# Patient Record
Sex: Male | Born: 1995 | Race: White | Hispanic: No | Marital: Single | Smoking: Former smoker
Health system: Southern US, Community
[De-identification: ages and names within clinical notes are randomized; demographics above are authoritative.]

## PROBLEM LIST (undated history)

## (undated) VITALS — BP 96/59 | HR 62 | Temp 97.5°F | Resp 16 | Ht 75.59 in | Wt 178.6 lb

## (undated) DIAGNOSIS — F909 Attention-deficit hyperactivity disorder, unspecified type: Secondary | ICD-10-CM

## (undated) DIAGNOSIS — F32A Depression, unspecified: Secondary | ICD-10-CM

## (undated) DIAGNOSIS — F329 Major depressive disorder, single episode, unspecified: Secondary | ICD-10-CM

## (undated) DIAGNOSIS — Z789 Other specified health status: Secondary | ICD-10-CM

## (undated) HISTORY — DX: Other specified health status: Z78.9

---
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
## 2017-06-24 ENCOUNTER — Emergency Department

## 2017-06-24 ENCOUNTER — Emergency Department
Admission: EM | Admit: 2017-06-24 | Discharge: 2017-06-24 | Attending: Emergency Medicine | Admitting: Emergency Medicine

## 2017-06-24 ENCOUNTER — Other Ambulatory Visit: Payer: Self-pay

## 2017-06-24 DIAGNOSIS — Z87891 Personal history of nicotine dependence: Secondary | ICD-10-CM | POA: Diagnosis not present

## 2017-06-24 DIAGNOSIS — Y939 Activity, unspecified: Secondary | ICD-10-CM | POA: Insufficient documentation

## 2017-06-24 DIAGNOSIS — Z23 Encounter for immunization: Secondary | ICD-10-CM | POA: Diagnosis not present

## 2017-06-24 DIAGNOSIS — Y999 Unspecified external cause status: Secondary | ICD-10-CM | POA: Insufficient documentation

## 2017-06-24 DIAGNOSIS — Y92143 Cell of prison as the place of occurrence of the external cause: Secondary | ICD-10-CM | POA: Diagnosis not present

## 2017-06-24 DIAGNOSIS — S0003XA Contusion of scalp, initial encounter: Secondary | ICD-10-CM | POA: Diagnosis not present

## 2017-06-24 DIAGNOSIS — S0990XA Unspecified injury of head, initial encounter: Secondary | ICD-10-CM | POA: Insufficient documentation

## 2017-06-24 DIAGNOSIS — F909 Attention-deficit hyperactivity disorder, unspecified type: Secondary | ICD-10-CM | POA: Diagnosis not present

## 2017-06-24 DIAGNOSIS — S022XXA Fracture of nasal bones, initial encounter for closed fracture: Secondary | ICD-10-CM | POA: Diagnosis not present

## 2017-06-24 LAB — URINALYSIS, COMPLETE (UACMP) WITH MICROSCOPIC
BILIRUBIN URINE: NEGATIVE
Bacteria, UA: NONE SEEN
Glucose, UA: NEGATIVE mg/dL
HGB URINE DIPSTICK: NEGATIVE
Ketones, ur: NEGATIVE mg/dL
Leukocytes, UA: NEGATIVE
NITRITE: NEGATIVE
Protein, ur: NEGATIVE mg/dL
SPECIFIC GRAVITY, URINE: 1.012 (ref 1.005–1.030)
Squamous Epithelial / LPF: NONE SEEN
pH: 5 (ref 5.0–8.0)

## 2017-06-24 MED ORDER — TETANUS-DIPHTH-ACELL PERTUSSIS 5-2.5-18.5 LF-MCG/0.5 IM SUSP
0.5000 mL | Freq: Once | INTRAMUSCULAR | Status: AC
  Administered 2017-06-24: 0.5 mL via INTRAMUSCULAR
  Filled 2017-06-24: qty 0.5

## 2017-06-24 NOTE — ED Notes (Signed)
PT given urinal 
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
## 2017-06-24 NOTE — Discharge Instructions (Signed)
Your CT scans shows a nondisplaced fracture of your nose.  No immediate treatment is needed for this, but you should follow up with ENT in 1 week for further evaluation.  Your elbow xray did not show any fractures. We gave you a tetanus shot today.  Your urine test did not show any blood in the urine.

## 2018-04-23 ENCOUNTER — Encounter (HOSPITAL_COMMUNITY): Payer: Self-pay | Admitting: Emergency Medicine

## 2018-04-23 ENCOUNTER — Emergency Department (HOSPITAL_COMMUNITY)
Admission: EM | Admit: 2018-04-23 | Discharge: 2018-04-24 | Disposition: A | Discharge status: Other Acute Inpatient Hospital | Payer: Medicaid Other | Attending: Emergency Medicine | Admitting: Emergency Medicine

## 2018-04-23 ENCOUNTER — Other Ambulatory Visit: Payer: Self-pay

## 2018-04-23 DIAGNOSIS — R45851 Suicidal ideations: Secondary | ICD-10-CM | POA: Insufficient documentation

## 2018-04-23 DIAGNOSIS — Z87891 Personal history of nicotine dependence: Secondary | ICD-10-CM | POA: Insufficient documentation

## 2018-04-23 DIAGNOSIS — F1220 Cannabis dependence, uncomplicated: Secondary | ICD-10-CM | POA: Insufficient documentation

## 2018-04-23 DIAGNOSIS — F909 Attention-deficit hyperactivity disorder, unspecified type: Secondary | ICD-10-CM | POA: Insufficient documentation

## 2018-04-23 DIAGNOSIS — F332 Major depressive disorder, recurrent severe without psychotic features: Secondary | ICD-10-CM | POA: Diagnosis present

## 2018-04-23 DIAGNOSIS — F333 Major depressive disorder, recurrent, severe with psychotic symptoms: Secondary | ICD-10-CM | POA: Insufficient documentation

## 2018-04-23 LAB — CBC
HCT: 47 % (ref 39.0–52.0)
Hemoglobin: 15.9 g/dL (ref 13.0–17.0)
MCH: 32 pg (ref 26.0–34.0)
MCHC: 33.8 g/dL (ref 30.0–36.0)
MCV: 94.6 fL (ref 80.0–100.0)
PLATELETS: 169 10*3/uL (ref 150–400)
RBC: 4.97 MIL/uL (ref 4.22–5.81)
RDW: 12 % (ref 11.5–15.5)
WBC: 7.7 10*3/uL (ref 4.0–10.5)
nRBC: 0.3 % — ABNORMAL HIGH (ref 0.0–0.2)

## 2018-04-23 LAB — RAPID URINE DRUG SCREEN, HOSP PERFORMED
Amphetamines: POSITIVE — AB
Barbiturates: NOT DETECTED
Benzodiazepines: NOT DETECTED
COCAINE: NOT DETECTED
OPIATES: NOT DETECTED
TETRAHYDROCANNABINOL: POSITIVE — AB

## 2018-04-23 LAB — COMPREHENSIVE METABOLIC PANEL
ALT: 19 U/L (ref 0–44)
ANION GAP: 6 (ref 5–15)
AST: 34 U/L (ref 15–41)
Albumin: 3.7 g/dL (ref 3.5–5.0)
Alkaline Phosphatase: 47 U/L (ref 38–126)
BUN: 16 mg/dL (ref 6–20)
CHLORIDE: 108 mmol/L (ref 98–111)
CO2: 28 mmol/L (ref 22–32)
Calcium: 8.6 mg/dL — ABNORMAL LOW (ref 8.9–10.3)
Creatinine, Ser: 0.96 mg/dL (ref 0.61–1.24)
GFR calc non Af Amer: >60 mL/min (ref >60)
GLUCOSE: 72 mg/dL (ref 70–99)
Potassium: 3.7 mmol/L (ref 3.5–5.1)
SODIUM: 142 mmol/L (ref 135–145)
Total Bilirubin: 0.8 mg/dL (ref 0.3–1.2)
Total Protein: 6 g/dL — ABNORMAL LOW (ref 6.5–8.1)

## 2018-04-23 LAB — SALICYLATE LEVEL

## 2018-04-23 LAB — ACETAMINOPHEN LEVEL

## 2018-04-23 LAB — ETHANOL: Alcohol, Ethyl (B): <10 mg/dL (ref <10)
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
## 2018-04-23 NOTE — BHH Counselor (Signed)
Per Cory Duncan, [facility]. Pt can come tomorrow (04/24/2018) after 0830. Attending physician: Dr. Otelia SanteeFarrah. Nursing report: [phone]. Updated disposition discussed with Cory LaymanLindsey, PA and Cory RainbowIlene, RN.    Redmond Pullingreylese D Alden Bensinger, MS, [facility], [facility] Triage Specialist [phone]

## 2018-04-23 NOTE — ED Triage Notes (Signed)
Pt presents for mental health evaluation with suicidal ideation but no plan.

## 2018-04-23 NOTE — ED Notes (Signed)
TTS called and advised pt was slated for inpatient status and assessment of bed availability was being done at this time [facility].

## 2018-04-23 NOTE — ED Notes (Signed)
Bed: [facility] Expected date:  Expected time:  Means of arrival:  Comments: 
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
## 2018-04-24 ENCOUNTER — Inpatient Hospital Stay (HOSPITAL_COMMUNITY): Admission: AD | Admit: 2018-04-24 | Payer: Medicaid Other | Source: Intra-hospital | Admitting: Psychiatry

## 2018-04-24 ENCOUNTER — Encounter (HOSPITAL_COMMUNITY): Payer: Self-pay

## 2018-04-24 ENCOUNTER — Inpatient Hospital Stay (HOSPITAL_COMMUNITY)
Admission: AD | Admit: 2018-04-24 | Discharge: 2018-04-28 | DRG: 885 | Disposition: A | Discharge status: Home / Self Care | Payer: Medicaid Other | Source: Intra-hospital | Attending: Psychiatry | Admitting: Psychiatry

## 2018-04-24 DIAGNOSIS — F4310 Post-traumatic stress disorder, unspecified: Secondary | ICD-10-CM | POA: Diagnosis not present

## 2018-04-24 DIAGNOSIS — Z813 Family history of other psychoactive substance abuse and dependence: Secondary | ICD-10-CM

## 2018-04-24 DIAGNOSIS — Z818 Family history of other mental and behavioral disorders: Secondary | ICD-10-CM | POA: Diagnosis not present

## 2018-04-24 DIAGNOSIS — R45851 Suicidal ideations: Secondary | ICD-10-CM | POA: Diagnosis not present

## 2018-04-24 DIAGNOSIS — G4700 Insomnia, unspecified: Secondary | ICD-10-CM | POA: Diagnosis present

## 2018-04-24 DIAGNOSIS — Z915 Personal history of self-harm: Secondary | ICD-10-CM | POA: Diagnosis not present

## 2018-04-24 DIAGNOSIS — F1910 Other psychoactive substance abuse, uncomplicated: Secondary | ICD-10-CM | POA: Diagnosis not present

## 2018-04-24 DIAGNOSIS — F419 Anxiety disorder, unspecified: Secondary | ICD-10-CM | POA: Diagnosis not present

## 2018-04-24 DIAGNOSIS — F323 Major depressive disorder, single episode, severe with psychotic features: Secondary | ICD-10-CM | POA: Diagnosis not present

## 2018-04-24 DIAGNOSIS — Z23 Encounter for immunization: Secondary | ICD-10-CM | POA: Diagnosis not present

## 2018-04-24 DIAGNOSIS — Z833 Family history of diabetes mellitus: Secondary | ICD-10-CM | POA: Diagnosis not present

## 2018-04-24 DIAGNOSIS — F17200 Nicotine dependence, unspecified, uncomplicated: Secondary | ICD-10-CM | POA: Diagnosis not present

## 2018-04-24 MED ORDER — MAGNESIUM HYDROXIDE 400 MG/5ML PO SUSP: 30.0000 mL | Freq: Every day | ORAL | Status: DC | PRN

## 2018-04-24 MED ORDER — PNEUMOCOCCAL VAC POLYVALENT 25 MCG/0.5ML IJ INJ
0.5000 mL | INJECTION | INTRAMUSCULAR | Status: AC
  Administered 2018-04-25: 0.5 mL via INTRAMUSCULAR

## 2018-04-24 MED ORDER — NICOTINE 21 MG/24HR TD PT24
21.0000 mg | MEDICATED_PATCH | Freq: Every day | TRANSDERMAL | Status: DC
  Administered 2018-04-24 – 2018-04-28 (×5): 21 mg via TRANSDERMAL
  Filled 2018-04-24 (×8): qty 1

## 2018-04-24 MED ORDER — PRAZOSIN HCL 1 MG PO CAPS
1.0000 mg | ORAL_CAPSULE | Freq: Every day | ORAL | Status: DC
  Administered 2018-04-24: 1 mg via ORAL
  Filled 2018-04-24 (×4): qty 1

## 2018-04-24 MED ORDER — ALUM & MAG HYDROXIDE-SIMETH 200-200-20 MG/5ML PO SUSP: 30.0000 mL | ORAL | Status: DC | PRN

## 2018-04-24 MED ORDER — HYDROXYZINE HCL 25 MG PO TABS
25.0000 mg | ORAL_TABLET | Freq: Three times a day (TID) | ORAL | Status: DC | PRN
  Administered 2018-04-24 – 2018-04-27 (×3): 25 mg via ORAL
  Filled 2018-04-24 (×3): qty 1

## 2018-04-24 MED ORDER — TRAZODONE HCL 50 MG PO TABS
50.0000 mg | ORAL_TABLET | Freq: Every evening | ORAL | Status: DC | PRN
  Administered 2018-04-24 – 2018-04-27 (×4): 50 mg via ORAL
  Filled 2018-04-24 (×4): qty 1

## 2018-04-24 MED ORDER — ACETAMINOPHEN 325 MG PO TABS: 650.0000 mg | ORAL_TABLET | Freq: Four times a day (QID) | ORAL | Status: DC | PRN

## 2018-04-24 NOTE — BHH Counselor (Signed)
Per RN AC, [facility].  May arrive at 1100.

## 2018-04-24 NOTE — Progress Notes (Signed)
D: Pt was at nurse's station upon initial approach.  Pt presents with anxious, depressed affect and mood.  When asked how his day has been, he states "I guess as good as it can get."  His goal is to "get all my mental health in order.  Get on the right medication.  Get my mind right."  Pt denies SI/HI, denies hallucinations, denies pain.  Pt has been visible in milieu interacting with peers and staff appropriately.  Pt attended evening group.  He requested medication for nightmares.  A: Introduced self to pt.  Met with pt 1:1.  Actively listened to pt and provided support and encouragement.  Medications administered per order.  On-site provider notified of pt's request and Minipress 1 mg PO QHS was ordered and administered.  PRN medication administered for sleep and anxiety.  Fall prevention techniques reviewed with pt and he verbalized understanding.  Q15 minute safety checks maintained.  R: Pt is safe on the unit.  Pt is compliant with medications.  Pt verbally contracts for safety.  Will continue to monitor and assess.

## 2018-04-24 NOTE — Progress Notes (Signed)
[facility]. Patient will be a voluntary admit and [facility].  Bed Number: 503-2  RN Call for report: [phone]  Enid Cutterharlotte Melizza Kanode, LCSW-A Clinical Social Worker [phone]

## 2018-04-24 NOTE — Progress Notes (Signed)
Adult Psychoeducational Group Note  Date:  04/24/2018 Time:  9:53 PM  Group Topic/Focus:  Wrap-Up Group:   The focus of this group is to help patients review their daily goal of treatment and discuss progress on daily workbooks.  Participation Level:  Active  Participation Quality:  Appropriate  Affect:  Appropriate  Cognitive:  Appropriate  Insight: Appropriate  Engagement in Group:  Engaged  Modes of Intervention:  Discussion  Additional Comments:  Patient said his day was a 5. His highlight for today was when he had a phone conversation with a friend.   Cory Duncan [name] [name] 04/24/2018, 9:53 PM

## 2018-04-24 NOTE — Progress Notes (Signed)
04/24/18  1207  Called [facility] for transport. Per [facility] they are on their way.

## 2018-04-24 NOTE — BHH Counselor (Signed)
[facility]. Patient will be a voluntary admit and [facility].  Bed Number: 454-0503-2  RN Call for [phone]
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
## 2018-04-24 NOTE — ED Notes (Addendum)
Patient belongings placed in locker 27 & 32

## 2018-04-24 NOTE — Progress Notes (Signed)
04/24/18  1200 [facility] [phone] Report given to Huntley DecSara.
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
## 2018-04-24 NOTE — Progress Notes (Signed)
Per [facility], Tresa EndoKelly, RN; Pt bed no longer available. Pt will be faxed out for placement.

## 2018-04-25 DIAGNOSIS — F323 Major depressive disorder, single episode, severe with psychotic features: Principal | ICD-10-CM

## 2018-04-25 DIAGNOSIS — F419 Anxiety disorder, unspecified: Secondary | ICD-10-CM

## 2018-04-25 DIAGNOSIS — F4310 Post-traumatic stress disorder, unspecified: Secondary | ICD-10-CM

## 2018-04-25 MED ORDER — ZIPRASIDONE MESYLATE 20 MG IM SOLR: 20.0000 mg | INTRAMUSCULAR | Status: DC | PRN

## 2018-04-25 MED ORDER — CITALOPRAM HYDROBROMIDE 10 MG PO TABS
10.0000 mg | ORAL_TABLET | Freq: Every day | ORAL | Status: DC
  Filled 2018-04-25 (×2): qty 1

## 2018-04-25 MED ORDER — CITALOPRAM HYDROBROMIDE 10 MG PO TABS
10.0000 mg | ORAL_TABLET | Freq: Every day | ORAL | Status: DC
  Administered 2018-04-26 – 2018-04-27 (×2): 10 mg via ORAL
  Filled 2018-04-25 (×4): qty 1

## 2018-04-25 MED ORDER — QUETIAPINE FUMARATE 50 MG PO TABS
50.0000 mg | ORAL_TABLET | Freq: Every day | ORAL | Status: DC
  Administered 2018-04-25: 50 mg via ORAL
  Filled 2018-04-25 (×4): qty 1

## 2018-04-25 MED ORDER — LORAZEPAM 1 MG PO TABS: 1.0000 mg | ORAL_TABLET | ORAL | Status: DC | PRN

## 2018-04-25 MED ORDER — OLANZAPINE 5 MG PO TBDP: 5.0000 mg | ORAL_TABLET | Freq: Three times a day (TID) | ORAL | Status: DC | PRN
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
## 2018-04-25 NOTE — [facility] Suicide Risk Assessment (Signed)
[facility] INPATIENT:  Family/Significant Other Suicide Prevention Education  Suicide Prevention Education:  Patient Refusal for Family/Significant Other Suicide Prevention Education: The patient Cory Duncan has refused to provide written consent for family/significant other to be provided Family/Significant Other Suicide Prevention Education during admission and/or prior to discharge.  Physician notified.  SPE completed with patient, as patient refused to consent to family contact. Patient was encouraged to share information with support network, ask questions, and talk about any concerns relating to SPE. Patient denies access to guns/firearms and verbalized understanding of information provided. Mobile Crisis information also provided to patient.    [name] 04/25/2018, 11:22 AM
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
## 2018-04-25 NOTE — Progress Notes (Signed)
NUTRITION ASSESSMENT  Pt identified as at risk on the Malnutrition Screen Tool  INTERVENTION: - Continue to encourage PO intakes.   NUTRITION DIAGNOSIS: Unintentional weight loss related to sub-optimal intake as evidenced by pt report.   Goal: Pt to meet >/= 90% of their estimated nutrition needs.  Monitor:  PO intake  Assessment:  Patient admitted for PTSD, depression, SI, and crying spells. Per notes, patient was incarcerated in August, recently broke up with his girlfriend, and lost his job. He reports that he has been having a decreased appetite but that he began using marijuana daily to increase appetite and decrease feelings of anxiety.   Per chart review, current weight is 171 lb and weight on 06/24/17 was 197 lb. This indicates 26 lb weight loss (13% body weight) in the past 10 months. This is not significant for time frame but unsure if weight loss occurred more acutely.     22 y.o. male  Height: Ht Readings from Last 1 Encounters:  04/24/18 6\' 5"  (1.956 m)    Weight: Wt Readings from Last 1 Encounters:  04/24/18 77.6 kg    Weight Hx: Wt Readings from Last 10 Encounters:  04/24/18 77.6 kg  04/23/18 77.6 kg  06/24/17 89.4 kg  03/22/14 93 kg (95 %, Z= 1.62)*  12/01/13 78.5 kg (79 %, Z= 0.82)*  12/01/13 83.9 kg (88 %, Z= 1.17)*  04/24/12 81 kg (90 %, Z= 1.27)*   * Growth percentiles are based on CDC (Boys, 2-20 Years) data.    BMI:  Body mass index is 20.28 kg/m. Pt meets criteria for normal weight based on current BMI.  Estimated Nutritional Needs: Kcal: 25-30 kcal/kg Protein: > 1 gram protein/kg Fluid: 1 ml/kcal  Diet Order:  Diet Order            Diet regular Room service appropriate? Yes; Fluid consistency: Thin  Diet effective now             Pt is also offered choice of unit snacks mid-morning and mid-afternoon.  Pt is eating as desired.   Lab results and medications reviewed.     Trenton GammonJessica Khaleah Duer, MS, RD, LDN, [facility]  Clinical Dietitian Pager # [phone] After hours/weekend pager # [phone]

## 2018-04-25 NOTE — Plan of Care (Signed)
Problem: Health Behavior/Discharge Planning: Goal: Identification of resources available to assist in meeting health care needs will improve Outcome: Progressing   Problem: Safety: Goal: Periods of time without injury will increase Outcome: Progressing   Problem: Medication: Goal: Compliance with prescribed medication regimen will improve Outcome: Progressing DAR Note: Pt awake in dayroom at this time. Presents animated but anxious on interactions. Denies SI, HI, AVH and pain. Rates his depression 3/10, hopelessness 0/10 and anxiety 3/10 on self inventory sheet. Report he slept fair last night with good appetite, normal energy and poor poor concentration level. Pt's goal for treatment team is "to get my diagnosis and my medicatons right". Took his medications without issues. Denies side effects. Intrusive flirtatious towards staff as shift progressed but has been redirectable and limits established. EKG done and pt was cooperative with procedure. Emotional support offered. Scheduled medications given per MD's orders with verbal education and effects monitored. Encouraged pt to voice concerns, attend to ADLS and comply with unit routines including groups. Safety checks continues at Q 15 minutes intervals without issues.  Pt denies concerns at this time. Tolerates all PO intake well. Remains safe on and off unit.

## 2018-04-25 NOTE — Progress Notes (Signed)
Recreation Therapy Notes  Date: 11.18.19 Time: 0930 Location: [address] Dayroom  Group Topic: Stress Management  Goal Area(s) Addresses:  Patient will verbalize importance of using healthy stress management.  Patient will identify positive emotions associated with healthy stress management.   Intervention: Stress Management  Activity :  Guided Imagery.  LRT introduced the stress management technique of guided imagery.  LRT read a script that took patients on a journey through the forest.  Patients were to listen as the script was read to engage in the activity.  Education:  Stress Management, Discharge Planning.   Education Outcome: Acknowledges edcuation/In group clarification offered/Needs additional education  Clinical Observations/Feedback: Pt did not attend group.     Caroll RancherMarjette Niv Darley,  LRT/CTRS         Lillia AbedLindsay, Karrington Mccravy A 04/25/2018 11:30 AM

## 2018-04-25 NOTE — Tx Team (Signed)
Interdisciplinary Treatment and Diagnostic Plan Update  04/25/2018 Time of Session:  Cory Duncan MRN: [id number]  Principal Diagnosis: <principal problem not specified>  Secondary Diagnoses: Active Problems:   Severe major depression with psychotic features, mood-congruent (HCC)   Current Medications:  Current Facility-Administered Medications  Medication Dose Route Frequency Provider Last Rate Last Dose  . acetaminophen (TYLENOL) tablet 650 mg  650 mg Oral Q6H PRN Lindon Romp A, NP      . alum & mag hydroxide-simeth (MAALOX/MYLANTA) 200-200-20 MG/5ML suspension 30 mL  30 mL Oral Q4H PRN Lindon Romp A, NP      . hydrOXYzine (ATARAX/VISTARIL) tablet 25 mg  25 mg Oral TID PRN Rozetta Nunnery, NP   25 mg at 04/24/18 2226  . magnesium hydroxide (MILK OF MAGNESIA) suspension 30 mL  30 mL Oral Daily PRN Lindon Romp A, NP      . nicotine (NICODERM CQ - dosed in mg/24 hours) patch 21 mg  21 mg Transdermal Daily Cobos, Myer Peer, MD   21 mg at 04/25/18 0818  . prazosin (MINIPRESS) capsule 1 mg  1 mg Oral QHS Lindon Romp A, NP   1 mg at 04/24/18 2103  . traZODone (DESYREL) tablet 50 mg  50 mg Oral QHS PRN Rozetta Nunnery, NP   50 mg at 04/24/18 2103   PTA Medications: Medications Prior to Admission  Medication Sig Dispense Refill Last Dose  . naproxen (NAPROSYN) 500 MG tablet Take 1 tablet (500 mg total) by mouth 2 (two) times daily. (Patient not taking: Reported on 06/24/2017) 30 tablet 0 Not Taking at Unknown time  . traMADol (ULTRAM) 50 MG tablet Take 1 tablet (50 mg total) by mouth every 6 (six) hours as needed. (Patient not taking: Reported on 06/24/2017) 15 tablet 0 Not Taking at Unknown time    Patient Stressors: Financial difficulties Medication change or noncompliance Substance abuse Traumatic event  Patient Strengths: Ability for insight Average or above average intelligence Communication skills General fund of knowledge Physical Health  Treatment Modalities:  Medication Management, Group therapy, Case management,  1 to 1 session with clinician, Psychoeducation, Recreational therapy.   Physician Treatment Plan for Primary Diagnosis: <principal problem not specified> Long Term Goal(s):     Short Term Goals:    Medication Management: Evaluate patient's response, side effects, and tolerance of medication regimen.  Therapeutic Interventions: 1 to 1 sessions, Unit Group sessions and Medication administration.  Evaluation of Outcomes: Not Met  Physician Treatment Plan for Secondary Diagnosis: Active Problems:   Severe major depression with psychotic features, mood-congruent ([place])  Long Term Goal(s):     Short Term Goals:       Medication Management: Evaluate patient's response, side effects, and tolerance of medication regimen.  Therapeutic Interventions: 1 to 1 sessions, Unit Group sessions and Medication administration.  Evaluation of Outcomes: Not Met   RN Treatment Plan for Primary Diagnosis: <principal problem not specified> Long Term Goal(s): Knowledge of disease and therapeutic regimen to maintain health will improve  Short Term Goals: Ability to participate in decision making will improve, Ability to verbalize feelings will improve, Ability to disclose and discuss suicidal ideas and Ability to identify and develop effective coping behaviors will improve  Medication Management: RN will administer medications as ordered by provider, will assess and evaluate patient's response and provide education to patient for prescribed medication. RN will report any adverse and/or side effects to prescribing provider.  Therapeutic Interventions: 1 on 1 counseling sessions, Psychoeducation, Medication administration, Evaluate responses to treatment,  Monitor vital signs and CBGs as ordered, Perform/monitor CIWA, COWS, AIMS and Fall Risk screenings as ordered, Perform wound care treatments as ordered.  Evaluation of Outcomes: Not Met   LCSW  Treatment Plan for Primary Diagnosis: <principal problem not specified> Long Term Goal(s): Safe transition to appropriate next level of care at discharge, Engage patient in therapeutic group addressing interpersonal concerns.  Short Term Goals: Engage patient in aftercare planning with referrals and resources  Therapeutic Interventions: Assess for all discharge needs, 1 to 1 time with Social worker, Explore available resources and support systems, Assess for adequacy in community support network, Educate family and significant other(s) on suicide prevention, Complete Psychosocial Assessment, Interpersonal group therapy.  Evaluation of Outcomes: Not Met   Progress in Treatment: Attending groups: No. Participating in groups: No. Taking medication as prescribed: Yes. Toleration medication: Yes. Family/Significant other contact made: No, will contact:  patient declines consent for collateral contacts Patient understands diagnosis: Yes. Discussing patient identified problems/goals with staff: Yes. Medical problems stabilized or resolved: Yes. Denies suicidal/homicidal ideation: Yes. Issues/concerns per patient self-inventory: No. Other:   New problem(s) identified: None   New Short Term/Long Term Goal(s):Detox, medication stabilization, elimination of SI thoughts, development of comprehensive mental wellness plan.    Patient Goals:  "To get my diagnosis and get on the right medications"  Discharge Plan or Barriers: CSW will assess for appropriate referrals and discharge planning.   Reason for Continuation of Hospitalization: Anxiety Depression Medication stabilization Suicidal ideation  Estimated Length of Stay: 3-5 days   Attendees: Patient: Cory Duncan 04/25/2018 3:24 PM  Physician: Dr. Neita Garnet, MD 04/25/2018 3:24 PM  Nursing: Nicoletta Dress.Viona Gilmore, RN 04/25/2018 3:24 PM  RN Care Manager: 04/25/2018 3:24 PM  Social Worker: Radonna Ricker, [place] 04/25/2018 3:24 PM   Recreational Therapist:  04/25/2018 3:24 PM  Other:  04/25/2018 3:24 PM  Other:  04/25/2018 3:24 PM  Other: 04/25/2018 3:24 PM    Scribe for Treatment Team: Marylee Floras, [place] 04/25/2018 3:24 PM

## 2018-04-26 LAB — LIPID PANEL
CHOL/HDL RATIO: 3.1 ratio
CHOLESTEROL: 147 mg/dL (ref 0–200)
HDL: 48 mg/dL (ref >40)
LDL Cholesterol: 89 mg/dL (ref 0–99)
TRIGLYCERIDES: 49 mg/dL (ref <150)
VLDL: 10 mg/dL (ref 0–40)

## 2018-04-26 LAB — HEMOGLOBIN A1C
Hgb A1c MFr Bld: 4.8 % (ref 4.8–5.6)
Mean Plasma Glucose: 91.06 mg/dL

## 2018-04-26 MED ORDER — QUETIAPINE FUMARATE 25 MG PO TABS
25.0000 mg | ORAL_TABLET | Freq: Every day | ORAL | Status: DC
  Administered 2018-04-26 – 2018-04-27 (×2): 25 mg via ORAL
  Filled 2018-04-26 (×5): qty 1

## 2018-04-26 NOTE — Progress Notes (Signed)
Pt was observed in the dayroom, seen putting together a puzzle with peers. Pt rates his day a 8/10. Pt appears animated/anxious in affect and mood. Pt denies SI/HI/AVH/Pain at this time. Pt is silly on approach. Cooperative on the unit. Support and encouragement provided. Seroquel was decrease to 25 mg at bedtime. PRN vistaril and trazodone requested and given. Will continue with POC.

## 2018-04-26 NOTE — Progress Notes (Signed)
D: Patient is alert, pleasant, and cooperative. Patient denies SI, HI, AVH, and verbally contracts for safety. Patient reports sleeping poor last night. Patient rates his depression 3/10 and anxiety 7/10. Patient denies physical symptoms/pain. Patient requests PRN sleep medication.    A: Scheduled medications administered per MD order. PRN sleep medication administered per MD order. Support provided. Patient educated on safety on the unit and medications. Routine safety checks every 15 minutes. Patient stated understanding to tell nurse about any new physical symptoms. Patient understands to tell staff of any needs.     R: No adverse drug reactions noted. Patient verbally contracts for safety. Patient remains safe at this time and will continue to monitor.

## 2018-04-26 NOTE — Progress Notes (Signed)
Pt presents with a sad affect and depressed mood. Pt noted to be guarded on approach and forwarded little information. Pt noted to have minimal interaction on the unit. Pt rated on his self inventory sheet depression 4/10, anxiety 4/10 and hopelessness 0. Pt denies SI/HI. Pt reported difficulty sleeping last night due to racing thoughts. Pt reported feeling tired this morning due to a lack of sleep.  Medications reviewed with pt. Medications administered as ordered per MD. Verbal support provided. Pt encouraged to attend groups. 15 minute checks performed for safety. V/s assessed.   Pt receptive to tx plan.

## 2018-04-26 NOTE — Progress Notes (Signed)
Patient attended wrap up group and participated.  

## 2018-04-26 NOTE — Progress Notes (Signed)
Adult Psychoeducational Group Note  Date:  04/26/2018 Time:  9:35 PM  Group Topic/Focus:  Wrap-Up Group:   The focus of this group is to help patients review their daily goal of treatment and discuss progress on daily workbooks.  Participation Level:  Active  Participation Quality:  Appropriate  Affect:  Appropriate  Cognitive:  Appropriate  Insight: Appropriate  Engagement in Group:  Engaged  Modes of Intervention:  Discussion  Additional Comments:  Pt goal was to get meds changed.  Pt met goal.  Pt rated the day at a 8/10.  Karly Pitter 04/26/2018, 9:35 PM

## 2018-04-26 NOTE — Plan of Care (Signed)
  Problem: Education: Goal: Knowledge of [facility] Education information/materials will improve Outcome: Progressing   Problem: Safety: Goal: Periods of time without injury will increase Outcome: Progressing   Patient oriented to the unit. Patient remains safe and will continue to monitor.  
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
## 2018-04-26 NOTE — [facility] Group Notes (Signed)
Adult Nursing Psychoeducational Group Note  Date:  04/26/2018 Time:  4:00 PM  Group Topic/Focus: Early Warning Signs Early Warning Signs:   The focus of this group is to help patients identify signs or symptoms they exhibit before slipping into an unhealthy state or crisis.  Participation Level:  Active  Participation Quality:  Appropriate  Affect:  Appropriate  Cognitive:  Alert and Oriented  Insight: Appropriate and Improving  Engagement in Group:  Developing/Improving  Modes of Intervention:  Discussion, Education and Socialization  Additional Comments:  Patient reports his early warning signs included shutting down emotionally and not opening up to his family.  Rae Lipsmanda A Parth Mccormac 04/26/2018, 4:45 PM

## 2018-04-27 MED ORDER — CITALOPRAM HYDROBROMIDE 20 MG PO TABS
20.0000 mg | ORAL_TABLET | Freq: Every day | ORAL | Status: DC
  Administered 2018-04-28: 20 mg via ORAL
  Filled 2018-04-27 (×3): qty 1

## 2018-04-27 NOTE — Progress Notes (Signed)
Adult Psychoeducational Group Note  Date:  04/27/2018 Time:  10:10 PM  Group Topic/Focus:  Wrap-Up Group:   The focus of this group is to help patients review their daily goal of treatment and discuss progress on daily workbooks.  Participation Level:  Active  Participation Quality:  Redirectable  Affect:  Anxious  Cognitive:  Appropriate  Insight: Improving  Engagement in Group:  Developing/Improving and Distracting  Modes of Intervention:  Discussion  Additional Comments:  Patient rated his day an "8." He listed three coping strategies that he has learned: staying positive, looking on the brighter side, and being more aware of feelings and moods. Patient was distracting at times. He was observed making silly faces and dances behind this facilitators back during group, but was redirectable.   Lyndee HensenGoins, Feliz Herard R 04/27/2018, 10:10 PM

## 2018-04-27 NOTE — Progress Notes (Signed)
Pt presents with an animated affect an anxious mood. Pt rated on his self inventory sheet 5/10, anxiety 3/10 and hopelessness 4/10. Pt reported improved sleep last night and decreased racing thoughts. Pt denies SI/HI. Pt denies AVH. Pt noted to be silly and flirtation during the day. Writer spoke with pt in regards to flirting with his peer.  Medications administered as ordered per MD. Verbal  Support provided. Pt encouraged to attend groups. 15 minute checks performed for safety.  Pt compliant with taking meds.

## 2018-04-27 NOTE — Progress Notes (Signed)
Recreation Therapy Notes  Date: 11.20.19 Time: 0930 Location: [address] Dayroom  Group Topic: Stress Management  Goal Area(s) Addresses:  Patient will verbalize importance of using healthy stress management.  Patient will identify positive emotions associated with healthy stress management.   Intervention: Stress Management  Activity :  Guided Imagery.  LRT introduced the stress management technique of guided imagery.  LRT read a script to guide patients through a peaceful meadow.  Patients were to follow along as script was read to engage in activity.  Education:  Stress Management, Discharge Planning.   Education Outcome: Acknowledges edcuation/In group clarification offered/Needs additional education  Clinical Observations/Feedback: Pt did not attend group.    Caroll RancherMarjette Codylee Patil, LRT/CTRS         Caroll RancherLindsay, Moncia Annas A 04/27/2018 11:28 AM

## 2018-04-27 NOTE — Progress Notes (Signed)
Pt was observed in the dayroom, attending wrap-up group.Pt appears animated/anxious in affect and mood. Pt denies SI/HI/AVH/Pain at this time. Pt is silly on approach. Cooperative on the unit. Pt states he hopes to go home tomorrow.Support and encouragement provided.PRN vistaril and trazodone requested and given. Will continue with POC.

## 2018-04-27 NOTE — Therapy (Signed)
Occupational Therapy Group Note  Date:  04/27/2018 Time:  2:35 PM  Group Topic/Focus:  Self Esteem Action Plan:   The focus of this group is to help patients create a plan to continue to build self-esteem after discharge.  Participation Level:  Active  Participation Quality:  Appropriate  Affect:  Appropriate  Cognitive:  Appropriate  Insight: Improving  Engagement in Group:  Engaged  Modes of Intervention:  Activity, Discussion, Education and Socialization  Additional Comments:   S: "My self esteem is always high"  O: OT tx with focus on self esteem building this date. Education given on definition of self esteem, with both causes of low and high self esteem identified. Activity given for pt to identify a positive/aspiring trait for each letter of the alphabet. Pt to work with peers to help complete activity and build positive thinking.   A: Pt presents to group with appropriate affect, engaged and participatory throughout session. Pt helped add to self esteem education by discussing with other peers. Pt being flirtatious with other group members during activity, and extra smiling and such at OT. A-z activity completed with min-mod VC's. Pt appropriately brainstorming with other peers for support. Noted improved affect after completion of activity.   P: Education given on self esteem and how to improve this date. Handouts and activities given to help facilitate skills when reintegrating into community.   Dalphine HandingKaylee Brenae Lasecki, MSOT, OTR/L Behavioral Health OT/ Acute Relief OT PHP Office: [phone]  Dalphine HandingKaylee Laurene Melendrez 04/27/2018, 2:35 PM

## 2018-04-27 NOTE — Progress Notes (Signed)
[facility] MD Progress Note  04/27/2018 11:09 AM Cory Duncan  MRN:  [id number] Subjective:  Patient is seen and examined.  Patient is a 22 year old male admitted on 04/24/2018 with suicidal ideation.  Patient has a history of depression with an admission on 12/01/2013.  He has a history of amphetamine and marijuana abuse  Objective: Patient is seen and examined.  Patient is a 22 year old male with the above-stated past psychiatric history seen in follow-up.    Patient stated he is feeling less foggy today.  He stated that the Seroquel reduction in dosage is helping.  He stated he felt like the Celexa was helping as well.  He stated he did not feel "as high or as low".  He stated he had contacted some of the [place] [facility] yesterday, but was going to work on that again more fully this afternoon.  He denied any suicidal ideation.  He denied any side effects to his current medications.  He slept 6.75 hours last night.  His vital signs are stable, he is afebrile.  Principal Problem: Severe major depression with psychotic features, mood-congruent (HCC) Diagnosis: Principal Problem:   Severe major depression with psychotic features, mood-congruent (HCC) Active Problems:   Polysubstance abuse (HCC)  Total Time spent with patient: 15 minutes  Past Psychiatric History: See admission H&P  Past Medical History:  Past Medical History:  Diagnosis Date  . ADHD (attention deficit hyperactivity disorder)   . Depression   . No pertinent past medical history    History reviewed. No pertinent surgical history. Family History:  Family History  Problem Relation Age of Onset  . Depression Mother   . Drug abuse Mother   . Depression Maternal Grandmother   . Drug abuse Father   . Diabetes Other    Family Psychiatric  History: See admission H&P Social History:  Social History   Substance and Sexual Activity  Alcohol Use No  . Frequency: Never   Comment: occ     Social History   Substance and  Sexual Activity  Drug Use Yes  . Frequency: 7.0 times per week  . Types: Benzodiazepines, Marijuana, Hydrocodone, Other-see comments    Social History   Socioeconomic History  . Marital status: Single    Spouse name: Not on file  . Number of children: Not on file  . Years of education: Not on file  . Highest education level: Not on file  Occupational History  . 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 Financial resource strain: Not on file  . Food insecurity:    Worry: Not on file    Inability: Not on file  . Transportation needs:    Medical: Not on file    Non-medical: Not on file  Tobacco Use  . Smoking status: Former Smoker    Packs/day: 1.50    Years: 1.00    Pack years: 1.50    Types: Cigarettes  . Smokeless tobacco: Never Used  Substance and Sexual Activity  . Alcohol use: No    Frequency: Never    Comment: occ  . Drug use: Yes    Frequency: 7.0 times per week    Types: Benzodiazepines, Marijuana, Hydrocodone, Other-see comments  . Sexual activity: Yes    Partners: Female    Birth control/protection: Condom  Lifestyle  . Physical activity:    Days per week: Not on file    Minutes per session: Not on file  . Stress: Not on  file  Relationships  . Social connections:    Talks on phone: Not on file    Gets together: Not on file    Attends religious service: Not on file    Active member of club or organization: Not on file    Attends meetings of clubs or organizations: Not on file    Relationship status: Not on file  Other Topics Concern  . Not on file  Social History Narrative  . Not on file   Additional Social History:                         Sleep: Good  Appetite:  Good  Current Medications: Current Facility-Administered Medications  Medication Dose Route Frequency Provider Last Rate Last Dose  . acetaminophen (TYLENOL) tablet 650 mg  650 mg Oral Q6H PRN Nira Conn A, NP      . alum & mag  hydroxide-simeth (MAALOX/MYLANTA) 200-200-20 MG/5ML suspension 30 mL  30 mL Oral Q4H PRN Nira Conn A, NP      . citalopram (CELEXA) tablet 10 mg  10 mg Oral Daily Cobos, Rockey Situ, MD   10 mg at 04/27/18 0801  . hydrOXYzine (ATARAX/VISTARIL) tablet 25 mg  25 mg Oral TID PRN Jackelyn Poling, NP   25 mg at 04/26/18 2057  . OLANZapine zydis (ZYPREXA) disintegrating tablet 5 mg  5 mg Oral Q8H PRN Cobos, Rockey Situ, MD       And  . LORazepam (ATIVAN) tablet 1 mg  1 mg Oral PRN Cobos, Rockey Situ, MD       And  . ziprasidone (GEODON) injection 20 mg  20 mg Intramuscular PRN Cobos, Rockey Situ, MD      . magnesium hydroxide (MILK OF MAGNESIA) suspension 30 mL  30 mL Oral Daily PRN Nira Conn A, NP      . nicotine (NICODERM CQ - dosed in mg/24 hours) patch 21 mg  21 mg Transdermal Daily Cobos, Rockey Situ, MD   21 mg at 04/27/18 0802  . QUEtiapine (SEROQUEL) tablet 25 mg  25 mg Oral QHS Antonieta Pert, MD   25 mg at 04/26/18 2057  . traZODone (DESYREL) tablet 50 mg  50 mg Oral QHS PRN Jackelyn Poling, NP   50 mg at 04/26/18 2057    Lab Results:  Results for orders placed or performed during the hospital encounter of 04/24/18 (from the past 48 hour(s))  Lipid panel     Status: None   Collection Time: 04/26/18  6:50 AM  Result Value Ref Range   Cholesterol 147 0 - 200 mg/dL   Triglycerides 49 <161 mg/dL   HDL 48 >09 mg/dL   Total CHOL/HDL Ratio 3.1 RATIO   VLDL 10 0 - 40 mg/dL   LDL Cholesterol 89 0 - 99 mg/dL    Comment:        Total Cholesterol/HDL:CHD Risk Coronary Heart Disease Risk Table                     Men   Women  1/2 Average Risk   3.4   3.3  Average Risk       5.0   4.4  2 X Average Risk   9.6   7.1  3 X Average Risk  23.4   11.0        Use the calculated Patient Ratio above and the CHD Risk Table to determine the patient's CHD Risk.  ATP III CLASSIFICATION (LDL):  <100     mg/dL   Optimal  161-096100-129  mg/dL   Near or Above                    Optimal  130-159   mg/dL   Borderline  [id number]  mg/dL   High  >811>190     mg/dL   Very High Performed at [facility], [address] [place], [place] [address]   Hemoglobin A1c     Status: None   Collection Time: 04/26/18  6:50 AM  Result Value Ref Range   Hgb A1c MFr Bld 4.8 4.8 - 5.6 %    Comment: (NOTE) Pre diabetes:          5.7%-6.4% Diabetes:              >6.4% Glycemic control for   <7.0% adults with diabetes    Mean Plasma Glucose 91.06 mg/dL    Comment: Performed at [facility], [address] [place], [place] [address]    Blood Alcohol level:  Lab Results  Component Value Date   [facility]   ETH <11 12/01/2013    Metabolic Disorder Labs: Lab Results  Component Value Date   HGBA1C 4.8 04/26/2018   MPG 91.06 04/26/2018   No results found for: PROLACTIN Lab Results  Component Value Date   CHOL 147 04/26/2018   TRIG 49 04/26/2018   HDL 48 04/26/2018   CHOLHDL 3.1 04/26/2018   VLDL 10 04/26/2018   LDLCALC 89 04/26/2018    Physical Findings: AIMS: Facial and Oral Movements Muscles of Facial Expression: None, normal Lips and Perioral Area: None, normal Jaw: None, normal Tongue: None, normal,Extremity Movements Upper (arms, wrists, hands, fingers): None, normal Lower (legs, knees, ankles, toes): None, normal, Trunk Movements Neck, shoulders, hips: None, normal, Overall Severity Severity of abnormal movements (highest score from questions above): None, normal Incapacitation due to abnormal movements: None, normal Patient's awareness of abnormal movements (rate only patient's report): No Awareness, Dental Status Current problems with teeth and/or dentures?: No Does patient usually wear dentures?: No  CIWA:    COWS:     Musculoskeletal: Strength & Muscle Tone: within normal limits Gait & Station: normal Patient leans: N/A  Psychiatric Specialty Exam: Physical Exam  Nursing note and vitals reviewed. Constitutional: He is oriented to  person, place, and time. He appears well-developed and well-nourished.  HENT:  Head: Normocephalic and atraumatic.  Respiratory: Effort normal.  Neurological: He is alert and oriented to person, place, and time.    ROS  Blood pressure (!) 117/51, pulse (!) 53, temperature (!) 97.5 F (36.4 C), temperature source Oral, resp. rate 20, height 6\' 5"  (1.956 m), weight 77.6 kg, SpO2 98 %.Body mass index is 20.28 kg/m.  General Appearance: Casual  Eye Contact:  Fair  Speech:  Normal Rate  Volume:  Normal  Mood:  Anxious  Affect:  Congruent  Thought Process:  Coherent and Descriptions of Associations: Intact  Orientation:  Full (Time, Place, and Person)  Thought Content:  Logical  Suicidal Thoughts:  No  Homicidal Thoughts:  No  Memory:  Immediate;   Fair Recent;   Fair Remote;   Fair  Judgement:  Intact  Insight:  Fair  Psychomotor Activity:  Normal  Concentration:  Concentration: Fair and Attention Span: Fair  Recall:  [employer]Fair  Fund of Knowledge:  Fair  Language:  Fair  Akathisia:  Negative  Handed:  Right  AIMS (if indicated):     Assets:  Desire for Improvement Physical Health Resilience  ADL's:  Intact  Cognition:  WNL  Sleep:  Number of Hours: 6.75     Treatment Plan Summary: Daily contact with patient to assess and evaluate symptoms and progress in treatment, Medication management and Plan : Patient is seen and examined.  Patient is a 22 year old male with the above-stated past psychiatric history who was admitted with worsening depression and suicidal ideation after losing his job, and a recent break-up with a girlfriend.  He is also on probation.   #1 increase Celexa to 20 mg p.o. daily for mood and anxiety. #2 continue Seroquel 25 mg p.o. nightly for sleep and mood stability. #3 continue hydroxyzine 25 mg p.o. 3 times daily as needed anxiety. #4 continue Zyprexa Zydis and lorazepam as needed for severe agitation. #5 patient continues to work on going to an [facility]  after discharge. #6 discharge planning-in progress.  Antonieta Pert, MD 04/27/2018, 11:09 AM

## 2018-04-28 MED ORDER — TRAZODONE HCL 50 MG PO TABS: 50.0000 mg | ORAL_TABLET | Freq: Every evening | ORAL | 0 refills | Status: DC | PRN

## 2018-04-28 MED ORDER — CITALOPRAM HYDROBROMIDE 20 MG PO TABS: 20.0000 mg | ORAL_TABLET | Freq: Every day | ORAL | 0 refills | Status: AC

## 2018-04-28 MED ORDER — QUETIAPINE FUMARATE 25 MG PO TABS: 25.0000 mg | ORAL_TABLET | Freq: Every day | ORAL | 0 refills | Status: AC

## 2018-04-28 MED ORDER — HYDROXYZINE HCL 25 MG PO TABS: 25.0000 mg | ORAL_TABLET | Freq: Three times a day (TID) | ORAL | 0 refills | Status: DC | PRN

## 2018-04-28 MED ORDER — NICOTINE 21 MG/24HR TD PT24: 21.0000 mg | MEDICATED_PATCH | Freq: Every day | TRANSDERMAL | 0 refills | Status: AC

## 2018-04-28 MED ORDER — NAPROXEN 500 MG PO TABS: 500.0000 mg | ORAL_TABLET | Freq: Two times a day (BID) | ORAL | 0 refills | Status: AC

## 2018-04-28 NOTE — Progress Notes (Signed)
  [facility] :  Will you be returning to the same living situation after discharge:  Yes,  patient reports he is discharging home with his grandmother  At discharge, do you have transportation home?: Yes,  bus passes Do you have the ability to pay for your medications: No.  Release of information consent forms completed and in the chart;  Patient's signature needed at discharge.  Patient to Follow up at: Follow-up Information    [facility]. Go on 05/04/2018.   Specialty:  Behavioral Health Why:  Please attend your hospital follow up appointment on Wednesday, 05/04/18 at 9:00am. Please be sure to bring your Photo ID,SSN, and any discharge paperwork from this hospitalization.   Contact information: [address] [place] [address] [phone]           Next level of care provider has access to [facility] Link:yes  Safety Planning and Suicide Prevention discussed: Yes,  with the patient   Have you used any form of tobacco in the last 30 days? (Cigarettes, Smokeless Tobacco, Cigars, and/or Pipes): Yes  Has patient been referred to the Quitline?: Patient refused referral  Patient has been referred for addiction treatment: N/A  Maeola SarahJolan E Taneasha Fuqua, LCSWA 04/28/2018, 10:13 AM

## 2018-04-28 NOTE — [facility] Suicide Risk Assessment (Signed)
[facility]   Principal Problem: Severe major depression with psychotic features, mood-congruent (HCC) Discharge Diagnoses: Principal Problem:   Severe major depression with psychotic features, mood-congruent (HCC) Active Problems:   Polysubstance abuse (HCC)   Total Time spent with patient: 20 minutes  Musculoskeletal: Strength & Muscle Tone: within normal limits Gait & Station: normal Patient leans: N/A  Psychiatric Specialty Exam: Review of Systems  All other systems reviewed and are negative.   Blood pressure (!) 117/49, pulse (!) 54, temperature 97.6 F (36.4 C), temperature source Oral, resp. rate 20, height 6\' 5"  (1.956 m), weight 77.6 kg, SpO2 98 %.Body mass index is 20.28 kg/m.  General Appearance: Casual  Eye Contact::  Good  Speech:  Normal Rate409  Volume:  Normal  Mood:  Euthymic  Affect:  Congruent  Thought Process:  Coherent and Descriptions of Associations: Intact  Orientation:  Full (Time, Place, and Person)  Thought Content:  Logical  Suicidal Thoughts:  No  Homicidal Thoughts:  No  Memory:  Immediate;   Fair Recent;   Fair Remote;   Fair  Judgement:  Intact  Insight:  Fair  Psychomotor Activity:  Normal  Concentration:  Good  Recall:  Good  Fund of Knowledge:Good  Language: Good  Akathisia:  Negative  Handed:  Right  AIMS (if indicated):     Assets:  Communication Skills Desire for Improvement Housing Leisure Time Physical Health Resilience Social Support  Sleep:  Number of Hours: 6.5  Cognition: WNL  ADL's:  Intact   Mental Status Per Nursing Assessment::   On Admission:  Suicidal ideation indicated by patient  Demographic Factors:  Male, Caucasian, Low socioeconomic status and Unemployed  Loss Factors: Legal issues  Historical Factors: Impulsivity  Risk Reduction Factors:   Living with another person, especially a relative and Positive social support  Continued Clinical Symptoms:  Depression:    Comorbid alcohol abuse/dependence Impulsivity Alcohol/Substance Abuse/Dependencies  Cognitive Features That Contribute To Risk:  None    Suicide Risk:  Minimal: No identifiable suicidal ideation.  Patients presenting with no risk factors but with morbid ruminations; may be classified as minimal risk based on the severity of the depressive symptoms  Follow-up Information    [facility]. Go on 05/04/2018.   Specialty:  Behavioral Health Why:  Please attend your hospital follow up appointment on Wednesday, 05/04/18 at 9:00a.  Contact information: [address] [place] [address] [phone]           Plan Of Care/Follow-up recommendations:  Activity:  ad lib  Antonieta PertGreg Lawson Laira Penninger, MD 04/28/2018, 10:08 AM

## 2018-04-28 NOTE — Progress Notes (Signed)
Discharge Note:  Patient is discharged with 2 bus tickets.  Patient denied SI and HI.  Denied A/V hallucinations.  Denied pain.  Patient stated he received all his belongings, clothing, toiletries, misc items, prescriptions, charger for ankle bracelet, etc.  Suicide prevention information given and discussed with patient who stated he understood and had no questions.  [employer] suicide information given to patient also.  Patient filled out his survey and put in appropriate box.  Patient stated he appreciated all assistance [facility].  All required discharge information given to patient.

## 2018-04-28 NOTE — Progress Notes (Signed)
D:  Patient's self inventory sheet, patient sleeps good, sleep medication helpful.  Good appetite, high energy level, poor concentration.  Rated depression 3, hopeless 2, anxiety 5.  Denied withdrawals.  Denied SI.  Denied physical problems.  Denied physical pain.  Goal is discharge plan.  Plans to make discharge plan.  No discharge plans. A:  Medications administered per MD orders.  Emotional support and encouragement given patient. R:  Denied SI and HI, contracts for safety.  Denied A/V hallucinations.  Safety maintained with 15 minute checks.

## 2018-04-28 NOTE — Discharge Summary (Signed)
Physician Discharge Summary Note  Patient:  Cory Duncan is an 22 y.o., male MRN:  [id number]  DOB:  1996/04/03  Patient phone:  [phone] (home)   Patient address:   [address] [place] [address],   Total Time spent with patient: Greater than 30  inutes  Date of Admission:  04/24/2018  Date of Discharge: 04-28-18  Reason for Admission: Stressed, depressed, suicidal thoughts with plans.  Principal Problem: Severe major depression with psychotic features, mood-congruent [facility])  Discharge Diagnoses: Principal Problem:   Severe major depression with psychotic features, mood-congruent (HCC) Active Problems:   Polysubstance abuse (HCC)  Past Psychiatric History: Mdd with psychosis.  Past Medical History:  Past Medical History:  Diagnosis Date  . ADHD (attention deficit hyperactivity disorder)   . Depression   . No pertinent past medical history    History reviewed. No pertinent surgical history.  Family History:  Family History  Problem Relation Age of Onset  . Depression Mother   . Drug abuse Mother   . Depression Maternal Grandmother   . Drug abuse Father   . Diabetes Other    Family Psychiatric  History: See H&P.  Social History:  Social History   Substance and Sexual Activity  Alcohol Use No  . Frequency: Never   Comment: occ     Social History   Substance and Sexual Activity  Drug Use Yes  . Frequency: 7.0 times per week  . Types: Benzodiazepines, Marijuana, Hydrocodone, Other-see comments    Social History   Socioeconomic History  . Marital status: Single    Spouse name: Not on file  . Number of children: Not on file  . Years of education: Not on file  . Highest education level: Not on file  Occupational History  . 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 Financial resource strain: Not on file  . Food insecurity:    Worry: Not on file    Inability: Not on file  .  Transportation needs:    Medical: Not on file    Non-medical: Not on file  Tobacco Use  . Smoking status: Former Smoker    Packs/day: 1.50    Years: 1.00    Pack years: 1.50    Types: Cigarettes  . Smokeless tobacco: Never Used  Substance and Sexual Activity  . Alcohol use: No    Frequency: Never    Comment: occ  . Drug use: Yes    Frequency: 7.0 times per week    Types: Benzodiazepines, Marijuana, Hydrocodone, Other-see comments  . Sexual activity: Yes    Partners: Female    Birth control/protection: Condom  Lifestyle  . Physical activity:    Days per week: Not on file    Minutes per session: Not on file  . Stress: Not on file  Relationships  . Social connections:    Talks on phone: Not on file    Gets together: Not on file    Attends religious service: Not on file    Active member of club or organization: Not on file    Attends meetings of clubs or organizations: Not on file    Relationship status: Not on file  Other Topics Concern  . Not on file  Social History Narrative  . Not on file   Hospital Course: (Per Md's admission evaluation): 22 year old single male, lives with grandparents. Presented to the ED voluntarily at the encouragement of family. States "  I have been really depressed, stressed". He reports recent suicidal ideations, with thoughts of jumping off a bridge recently. He states he has been more depressed recently after losing losing his job about a week ago as well as recent break up with GF. He states he is also under stress due to being on probation and feels that PO " is trying to get me back in jail". He reports he was released from incarceration in August of 2019. States depression is chronic and was also experiencing depression while incarcerated. Endorses neuro-vegetative symptoms of depression as below. Admission UDS positive for Amphetamines and Cannabis. Patient acknowledges recent use of Adderall but states use was isolated and denies any pattern of  abuse.   After the above admission assessment, Zhamir was started on the medication regimen for his presenting symptoms. He received & was discharged on Citalopram 20 mg for depression, Vistaril 25 mg prn for anxiety, Nicotine patch 21 mg for smoking cessation, Seroquel 25 mg Q hs for mood control/agitation & trazodone 50 mg prn for insomnia. He was enrolled & participated in the group counseling sessions being offered & held on this unit. He learned coping skills. He presented no other significant medical issues that required treatment & or monitoring. He tolerated his treatment regimen without any adverse effects or reactions reported.  Tyion is seen today for discharge. He has normal anxiety about going home. He is not overwhelmed by this. He is looking forward to working on his mental. Not expressing any delusions today. No hallucination. Feels in control of himself.  No fantasy about suicide lately. No suicidal thoughts. No thoughts of violence. No craving for drugs. Does not feel depressed. No evidence of mania.   Nursing staff reports that patient has been appropriate on the unit. Patient has been interacting well with peers. No behavioral issues. Patient has not voiced any suicidal thoughts. Patient has not been observed to be internally stimulated or preoccupied. Patient has been adherent with treatment recommendations. Patient has been tolerating his medication well. No reported adverse effects or reactions.    Patient was discussed at the treatment team meeting this morning. Team members feels that patient is back to his baseline level of function. Team agrees with plan to discharge patient today. He left [facility] with all personal belongings in no apparent distress.    Physical Findings: AIMS: Facial and Oral Movements Muscles of Facial Expression: None, normal Lips and Perioral Area: None, normal Jaw: None, normal Tongue: None, normal,Extremity Movements Upper (arms, wrists, hands,  fingers): None, normal Lower (legs, knees, ankles, toes): None, normal, Trunk Movements Neck, shoulders, hips: None, normal, Overall Severity Severity of abnormal movements (highest score from questions above): None, normal Incapacitation due to abnormal movements: None, normal Patient's awareness of abnormal movements (rate only patient's report): No Awareness, Dental Status Current problems with teeth and/or dentures?: No Does patient usually wear dentures?: No  CIWA:  CIWA-Ar Total: 1 COWS:  COWS Total Score: 1  Musculoskeletal: Strength & Muscle Tone: within normal limits Gait & Station: normal Patient leans: N/A  Psychiatric Specialty Exam: Physical Exam  Nursing note and vitals reviewed. Constitutional: He is oriented to person, place, and time. He appears well-developed.  HENT:  Head: Normocephalic.  Eyes: Pupils are equal, round, and reactive to light.  Neck: Normal range of motion.  Cardiovascular: Normal rate.  Respiratory: Effort normal.  GI: Soft.  Genitourinary:  Genitourinary Comments: Deferred  Neurological: He is alert and oriented to person, place, and time.  Skin: Skin  is warm.    Review of Systems  Constitutional: Negative.   HENT: Negative.   Respiratory: Negative for cough and shortness of breath.   Cardiovascular: Negative.  Negative for chest pain and palpitations.  Gastrointestinal: Negative.  Negative for abdominal pain, heartburn, nausea and vomiting.  Genitourinary: Negative.   Musculoskeletal: Negative.   Skin: Negative.   Neurological: Negative.  Negative for dizziness.  Endo/Heme/Allergies: Negative.   Psychiatric/Behavioral: Positive for depression (Stable) and substance abuse (Hx. Amphetamin & THC use). Negative for hallucinations, memory loss and suicidal ideas. The patient has insomnia (Stable). The patient is not nervous/anxious (Stable).     Blood pressure (!) 117/49, pulse (!) 54, temperature 97.6 F (36.4 C), temperature source Oral,  resp. rate 20, height 6\' 5"  (1.956 m), weight 77.6 kg, SpO2 98 %.Body mass index is 20.28 kg/m.  See Md's discharge SRA   Have you used any form of tobacco in the last 30 days? (Cigarettes, Smokeless Tobacco, Cigars, and/or Pipes): Yes  Has this patient used any form of tobacco in the last 30 days? (Cigarettes, Smokeless Tobacco, Cigars, and/or Pipes): Yes, an FDA-approved tobacco cessation medication was offered at discharge.  Blood Alcohol level:  Lab Results  Component Value Date   [facility] 04/23/2018   ETH <11 12/01/2013   Metabolic Disorder Labs:  Lab Results  Component Value Date   HGBA1C 4.8 04/26/2018   MPG 91.06 04/26/2018   No results found for: PROLACTIN Lab Results  Component Value Date   CHOL 147 04/26/2018   TRIG 49 04/26/2018   HDL 48 04/26/2018   CHOLHDL 3.1 04/26/2018   VLDL 10 04/26/2018   LDLCALC 89 04/26/2018   See Psychiatric Specialty Exam and Suicide Risk Assessment completed by Attending Physician prior to discharge.  Discharge destination:  Home  Is patient on multiple antipsychotic therapies at discharge:  No   Has Patient had three or more failed trials of antipsychotic monotherapy by history:  No  Recommended Plan for Multiple Antipsychotic Therapies: NA  Allergies as of 04/28/2018   No Known Allergies     Medication List    STOP taking these medications   traMADol 50 MG tablet Commonly known as:  ULTRAM     TAKE these medications     Indication  citalopram 20 MG tablet Commonly known as:  CELEXA Take 1 tablet (20 mg total) by mouth daily. For depression Start taking on:  04/29/2018  Indication:  Depression   hydrOXYzine 25 MG tablet Commonly known as:  ATARAX/VISTARIL Take 1 tablet (25 mg total) by mouth 3 (three) times daily as needed for anxiety.  Indication:  Feeling Anxious   naproxen 500 MG tablet Commonly known as:  NAPROSYN Take 1 tablet (500 mg total) by mouth 2 (two) times daily. (May buy from over the counter): For  pain What changed:  additional instructions  Indication:  Pain   nicotine 21 mg/24hr patch Commonly known as:  NICODERM CQ - dosed in mg/24 hours Place 1 patch (21 mg total) onto the skin daily. (May buy from over the counter): For smoking cessation Start taking on:  04/29/2018  Indication:  Nicotine Addiction   QUEtiapine 25 MG tablet Commonly known as:  SEROQUEL Take 1 tablet (25 mg total) by mouth at bedtime. For mood control/agitation  Indication:  Agitation, Mood control   traZODone 50 MG tablet Commonly known as:  DESYREL Take 1 tablet (50 mg total) by mouth at bedtime as needed for sleep.  Indication:  Trouble Sleeping  Follow-up Information    [facility]. Go on 05/04/2018.   Specialty:  Behavioral Health Why:  Please attend your hospital follow up appointment on Wednesday, 05/04/18 at 9:00am. Please be sure to bring your Photo ID,SSN, and any discharge paperwork from this hospitalization.   [address] [place] [address] [phone]          Follow-up recommendations: Activity:  As tolerated Diet: As recommended by your primary care doctor. Keep all scheduled follow-up appointments as recommended.   Comments: Patient is instructed prior to discharge to: Take all medications as prescribed by his/her mental healthcare provider. Report any adverse effects and or reactions from the medicines to his/her outpatient provider promptly. Patient has been instructed & cautioned: To not engage in alcohol and or illegal drug use while on prescription medicines. In the event of worsening symptoms, patient is instructed to call the crisis hotline, 911 and or go to the nearest ED for appropriate evaluation and treatment of symptoms. To follow-up with his/her primary care provider for your other medical issues, concerns and or health care needs.   Signed: Armandina Stammer, NP, PMHNP, FNP-BC 04/28/2018, 10:26 AM
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
## 2022-04-02 ENCOUNTER — Ambulatory Visit (HOSPITAL_COMMUNITY)
Admission: RE | Admit: 2022-04-02 | Discharge: 2022-04-02 | Disposition: A | Discharge status: Home / Self Care | Payer: Self-pay | Attending: Psychiatry | Admitting: Psychiatry

## 2022-04-02 ENCOUNTER — Encounter (HOSPITAL_COMMUNITY): Payer: Self-pay | Admitting: Emergency Medicine

## 2022-04-02 ENCOUNTER — Other Ambulatory Visit: Payer: Self-pay

## 2022-04-02 ENCOUNTER — Emergency Department (HOSPITAL_COMMUNITY)
Admission: EM | Admit: 2022-04-02 | Discharge: 2022-04-04 | Disposition: A | Discharge status: Other Acute Inpatient Hospital | Payer: Medicaid Other | Attending: Emergency Medicine | Admitting: Emergency Medicine

## 2022-04-02 DIAGNOSIS — F1010 Alcohol abuse, uncomplicated: Secondary | ICD-10-CM | POA: Diagnosis not present

## 2022-04-02 DIAGNOSIS — F332 Major depressive disorder, recurrent severe without psychotic features: Secondary | ICD-10-CM | POA: Diagnosis present

## 2022-04-02 DIAGNOSIS — F1410 Cocaine abuse, uncomplicated: Secondary | ICD-10-CM | POA: Diagnosis not present

## 2022-04-02 DIAGNOSIS — F1290 Cannabis use, unspecified, uncomplicated: Secondary | ICD-10-CM | POA: Diagnosis present

## 2022-04-02 DIAGNOSIS — R4589 Other symptoms and signs involving emotional state: Secondary | ICD-10-CM

## 2022-04-02 DIAGNOSIS — R45850 Homicidal ideations: Secondary | ICD-10-CM | POA: Diagnosis not present

## 2022-04-02 DIAGNOSIS — R45851 Suicidal ideations: Secondary | ICD-10-CM | POA: Insufficient documentation

## 2022-04-02 DIAGNOSIS — Z20822 Contact with and (suspected) exposure to covid-19: Secondary | ICD-10-CM | POA: Diagnosis not present

## 2022-04-02 DIAGNOSIS — F1994 Other psychoactive substance use, unspecified with psychoactive substance-induced mood disorder: Secondary | ICD-10-CM

## 2022-04-02 DIAGNOSIS — F1110 Opioid abuse, uncomplicated: Secondary | ICD-10-CM | POA: Insufficient documentation

## 2022-04-02 DIAGNOSIS — Z046 Encounter for general psychiatric examination, requested by authority: Secondary | ICD-10-CM | POA: Diagnosis present

## 2022-04-02 DIAGNOSIS — F1910 Other psychoactive substance abuse, uncomplicated: Secondary | ICD-10-CM

## 2022-04-02 LAB — RAPID URINE DRUG SCREEN, HOSP PERFORMED
Amphetamines: NOT DETECTED
Barbiturates: NOT DETECTED
Benzodiazepines: NOT DETECTED
Cocaine: POSITIVE — AB
Opiates: NOT DETECTED
Tetrahydrocannabinol: POSITIVE — AB

## 2022-04-02 LAB — CBC
HCT: 41.4 % (ref 39.0–52.0)
Hemoglobin: 13.8 g/dL (ref 13.0–17.0)
MCH: 32.2 pg (ref 26.0–34.0)
MCHC: 33.3 g/dL (ref 30.0–36.0)
MCV: 96.7 fL (ref 80.0–100.0)
Platelets: 176 10*3/uL (ref 150–400)
RBC: 4.28 MIL/uL (ref 4.22–5.81)
RDW: 12.1 % (ref 11.5–15.5)
WBC: 6.4 10*3/uL (ref 4.0–10.5)
nRBC: 0 % (ref 0.0–0.2)

## 2022-04-02 LAB — COMPREHENSIVE METABOLIC PANEL
ALT: 15 U/L (ref 0–44)
AST: 18 U/L (ref 15–41)
Albumin: 3.4 g/dL — ABNORMAL LOW (ref 3.5–5.0)
Alkaline Phosphatase: 47 U/L (ref 38–126)
Anion gap: 5 (ref 5–15)
BUN: 17 mg/dL (ref 6–20)
CO2: 29 mmol/L (ref 22–32)
Calcium: 8.7 mg/dL — ABNORMAL LOW (ref 8.9–10.3)
Chloride: 108 mmol/L (ref 98–111)
Creatinine, Ser: 1.13 mg/dL (ref 0.61–1.24)
GFR, Estimated: >60 mL/min (ref >60)
Glucose, Bld: 81 mg/dL (ref 70–99)
Potassium: 4.2 mmol/L (ref 3.5–5.1)
Sodium: 142 mmol/L (ref 135–145)
Total Bilirubin: 0.8 mg/dL (ref 0.3–1.2)
Total Protein: 6.1 g/dL — ABNORMAL LOW (ref 6.5–8.1)

## 2022-04-02 LAB — ETHANOL: Alcohol, Ethyl (B): <10 mg/dL (ref <10)

## 2022-04-02 LAB — RESP PANEL BY RT-PCR (FLU A&B, COVID) ARPGX2
Influenza A by PCR: NEGATIVE
Influenza B by PCR: NEGATIVE
SARS Coronavirus 2 by RT PCR: NEGATIVE

## 2022-04-02 MED ORDER — NICOTINE 21 MG/24HR TD PT24
21.0000 mg | MEDICATED_PATCH | Freq: Every day | TRANSDERMAL | Status: DC
  Administered 2022-04-02 – 2022-04-04 (×3): 21 mg via TRANSDERMAL
  Filled 2022-04-02 (×2): qty 1

## 2022-04-02 MED ORDER — LORAZEPAM 1 MG PO TABS
1.0000 mg | ORAL_TABLET | Freq: Once | ORAL | Status: AC
  Administered 2022-04-02: 1 mg via ORAL
  Filled 2022-04-02: qty 1
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
## 2022-04-02 NOTE — ED Provider Notes (Signed)
[facility] DEPT Provider Note   CSN: [id number] Arrival date & time: 04/02/22  1242     History  Chief Complaint  Patient presents with   Psychiatric Evaluation   Suicidal    Cory Duncan is a 26 y.o. male.  Pt is a 26 yo male with a pmhx significant for polysubstance abuse, adhd, and depression.  He initially presented to [facility] with si/hi.  He was sent here for medical clearance.  Pt said he's been using cocaine, alcohol, and suboxone.  His grandmother took him to [facility] because she was worried about him.        Home Medications Prior to Admission medications   Medication Sig Start Date End Date Taking? Authorizing Provider  citalopram (CELEXA) 20 MG tablet Take 1 tablet (20 mg total) by mouth daily. For depression 04/29/18   Lindell Spar I, NP  hydrOXYzine (ATARAX/VISTARIL) 25 MG tablet Take 1 tablet (25 mg total) by mouth 3 (three) times daily as needed for anxiety. 04/28/18   Lindell Spar I, NP  naproxen (NAPROSYN) 500 MG tablet Take 1 tablet (500 mg total) by mouth 2 (two) times daily. (May buy from over the counter): For pain 04/28/18   Lindell Spar I, NP  nicotine (NICODERM CQ - DOSED IN MG/24 HOURS) 21 mg/24hr patch Place 1 patch (21 mg total) onto the skin daily. (May buy from over the counter): For smoking cessation 04/29/18   Lindell Spar I, NP  QUEtiapine (SEROQUEL) 25 MG tablet Take 1 tablet (25 mg total) by mouth at bedtime. For mood control/agitation 04/28/18   Lindell Spar I, NP  traZODone (DESYREL) 50 MG tablet Take 1 tablet (50 mg total) by mouth at bedtime as needed for sleep. 04/28/18   Encarnacion Slates, NP      Allergies    Patient has no known allergies.    Review of Systems   Review of Systems  Psychiatric/Behavioral:  Positive for suicidal ideas.   All other systems reviewed and are negative.   Physical Exam Updated Vital Signs BP 119/65   Pulse (!) 59   Temp 97.9 F (36.6 C) (Oral)   Resp 18   Ht 6\' 5"   (1.956 m)   Wt 86.2 kg   SpO2 98%   BMI 22.53 kg/m  Physical Exam Vitals and nursing note reviewed.  Constitutional:      Appearance: Normal appearance.  HENT:     Head: Normocephalic and atraumatic.     Right Ear: External ear normal.     Left Ear: External ear normal.     Nose: Nose normal.     Mouth/Throat:     Mouth: Mucous membranes are dry.  Eyes:     Extraocular Movements: Extraocular movements intact.     Conjunctiva/sclera: Conjunctivae normal.     Pupils: Pupils are equal, round, and reactive to light.  Cardiovascular:     Rate and Rhythm: Normal rate and regular rhythm.     Pulses: Normal pulses.     Heart sounds: Normal heart sounds.  Pulmonary:     Effort: Pulmonary effort is normal.     Breath sounds: Normal breath sounds.  Abdominal:     General: Abdomen is flat. Bowel sounds are normal.     Palpations: Abdomen is soft.  Musculoskeletal:        General: Normal range of motion.     Cervical back: Normal range of motion and neck supple.  Skin:    General: Skin is warm.  Capillary Refill: Capillary refill takes less than 2 seconds.  Neurological:     General: No focal deficit present.     Mental Status: He is alert and oriented to person, place, and time.  Psychiatric:        Thought Content: Thought content includes suicidal ideation.     ED Results / Procedures / Treatments   Labs (all labs ordered are listed, but only abnormal results are displayed) Labs Reviewed  COMPREHENSIVE METABOLIC PANEL - Abnormal; Notable for the following components:      Result Value   Calcium 8.7 (*)    Total Protein 6.1 (*)    Albumin 3.4 (*)    All other components within normal limits  RAPID URINE DRUG SCREEN, HOSP PERFORMED - Abnormal; Notable for the following components:   Cocaine POSITIVE (*)    Tetrahydrocannabinol POSITIVE (*)    All other components within normal limits  SARS CORONAVIRUS 2 BY RT PCR  RESP PANEL BY RT-PCR (FLU A&B, COVID) ARPGX2   ETHANOL  CBC    EKG None  Radiology No results found.  Procedures Procedures    Medications Ordered in ED Medications - No data to display  ED Course/ Medical Decision Making/ A&P                           Medical Decision Making Amount and/or Complexity of Data Reviewed Labs: ordered.   This patient presents to the ED for concern of si, this involves an extensive number of treatment options, and is a complaint that carries with it a high risk of complications and morbidity.  The differential diagnosis includes depression   Co morbidities that complicate the patient evaluation  Depression, adhd, polysubstance abuse   Additional history obtained:  Additional history obtained from epic chart review  Lab Tests:  I Ordered, and personally interpreted labs.  The pertinent results include:  uds + for cocaine and MJ; cmp nl; etoh neg  Medicines ordered and prescription drug management:   I have reviewed the patients home medicines and have made adjustments as needed    Critical Interventions:  Tts consult   Consultations Obtained:  I requested consultation with TTS,  and discussed lab and imaging findings as well as pertinent plan -consult pending   Problem List / ED Course:  SI/HI and polysubstance abuse:  TTS consult pending.   Reevaluation:  After the interventions noted above, I reevaluated the patient and found that they have :stayed the same   Social Determinants of Health:  Recently released from jail/unemployed   Dispostion:  After consideration of the diagnostic results and the patients response to treatment, I feel that the patent would benefit from psychiatric admission.          Final Clinical Impression(s) / ED Diagnoses Final diagnoses:  Suicidal ideation  Homicidal ideation  Polysubstance abuse [facility])    Rx / DC Orders ED Discharge Orders     None         Jacalyn Lefevre, MD 04/02/22 1525

## 2022-04-02 NOTE — ED Triage Notes (Signed)
Pt reports being sent from [facility] for SI and HI. Reports he has a plan for both.

## 2022-04-02 NOTE — ED Notes (Signed)
Pt was brought over by safe transport from [facility] due to [facility] "not having any beds."

## 2022-04-03 DIAGNOSIS — F1290 Cannabis use, unspecified, uncomplicated: Secondary | ICD-10-CM | POA: Diagnosis present

## 2022-04-03 DIAGNOSIS — F1410 Cocaine abuse, uncomplicated: Secondary | ICD-10-CM

## 2022-04-03 DIAGNOSIS — F332 Major depressive disorder, recurrent severe without psychotic features: Secondary | ICD-10-CM

## 2022-04-03 DIAGNOSIS — R45850 Homicidal ideations: Secondary | ICD-10-CM

## 2022-04-03 NOTE — Progress Notes (Signed)
[facility] is reviewing this Pt for [facility] inpatient. CSW sent additional requested information via fax for review.    Mariea Clonts, MSW, LCSW-A  9:40 PM 04/03/2022

## 2022-04-03 NOTE — ED Notes (Signed)
Call from [place] at [phone] [place] is currently  being considered for possible admission his behavior , PMHx , and labs were discussed.

## 2022-04-03 NOTE — ED Provider Notes (Signed)
Emergency Medicine Observation Re-evaluation Note  Dana Dorner is a 26 y.o. male, seen on rounds today.  Pt initially presented to the ED for complaints of Psychiatric Evaluation and Suicidal Currently, the patient is asleep.  No problems overnight.  Physical Exam  BP (!) 99/54 (BP Location: Left Arm)   Pulse (!) 59   Temp 97.8 F (36.6 C) (Oral)   Resp 14   Ht 6\' 5"  (1.956 m)   Wt 86.2 kg   SpO2 100%   BMI 22.53 kg/m  Physical Exam General: asleep Cardiac: rr Lungs: clear Psych: asleep  ED Course / MDM  EKG:   I have reviewed the labs performed to date as well as medications administered while in observation.  Recent changes in the last 24 hours include TTS eval.  Pt awaiting a bed at [facility].  Plan  Current plan is for awaiting inpatient psych placement.    Isla Pence, MD 04/03/22 [phone]

## 2022-04-03 NOTE — ED Notes (Signed)
Lying in bed watching television no distress noted. Pt stated " Got two babies being born in the next few days". " I got these lips tattoo on my forehead for the ladies". Two cell phones brought to the nurse station by security officer alerted will do random room check this shift.

## 2022-04-03 NOTE — Progress Notes (Signed)
[facility] LCSW Progress Note   04/03/2022    11:21 PM  Cory Duncan   [id number]   Type of Contact and Topic:  Psychiatric Bed Placement   Pt accepted to [facility]     Patient meets inpatient criteria per Lindon Romp, NP  The attending provider will be Dr. Franchot Mimes  Call report to [phone]  Jeanie Sewer, RN @ [facility] ED notified.     Pt scheduled  to arrive at [place] 0900.    Mariea Clonts, MSW, LCSW-A  11:23 PM 04/03/2022

## 2022-04-03 NOTE — ED Notes (Addendum)
Per Charisse March [place] LCSW  Cory Duncan has been accepted by [facility]  at 0900 on 04/03/2022.

## 2022-04-03 NOTE — Progress Notes (Signed)
Inpatient Behavioral Health placement  Pt meets inpatient criteria per Lindon Romp, NP.  Referral was sent to the following facilities;    Destination Service Provider Address Phone Fax  [address] [place] [address] [phone]  [facility]  [place], [place] [address] [place]  [facility]  [address] [place] [address] [phone]  [facility]  [place], [place] [address] [phone]  [facility]  [address] [place] [address] [place]  [facility]  [address], [place] [address] [phone]  [place] [address], [place] [address] [phone]  [facility]  [place] [address] [phone] [place]  [address], [place] [address] [phone] [facility]  [address] [place] [address] [phone] [place]  [address] [place] [address] [phone] [facility]  [address] [place] [address] [phone] [facility]  [address], [place] [address] [facility]  [place], [place] [address] [phone]  [facility]  [address] [place] [address] [phone]  [facility]  [place], [place] [address] [phone]   Situation ongoing,  CSW will follow up.   Benjaman Kindler, MSW, LCSWA 04/03/2022  @ 5:15 PM

## 2022-04-03 NOTE — Progress Notes (Cosign Needed Addendum)
[facility] ED Progress Note  04/03/2022 1:10 PM Cory OhmsChristopher Duncan  MRN:  [id number]   Principal Problem: MDD (major depressive disorder), recurrent episode, severe (HCC) Diagnosis:  Principal Problem:   MDD (major depressive disorder), recurrent episode, severe (HCC) Active Problems:   Cocaine abuse (HCC)   Marijuana use   ED Assessment Time Calculation: Start Time: 1230 Stop Time: 1245 Total Time in Minutes (Assessment Completion): 15   Subjective:  Cory Duncan is a 26 y.o. Caucasian male who presented voluntarily accompanied by his grandmother as a walk-in to [facility] for worsening suicidal ideation, and depression.  Patient has past psychiatric history of conduct disorder adolescent onset type, dysthymic disorder, major depressive disorder recurrent episode severe, polysubstance abuse, severe major depression with psychotic features, mood congruent, ADHD.  As per chart review patient has multiple ED visits and admissions to [facility] from [facility]-2019.  Patient was incarcerated from Oct 22, 2021 to October 2023 for breaking and entering, larceny of gun, and obtaining property by false pretense.  Patient to reappear in court in [place] on May 05, 2022 for grand larceny.   HPI: The patient was found resting comfortably in bed during the evaluation. He reported that the previous day he had formulated a plan to crash his car but did not express any current suicidal or homicidal thoughts. Additionally, he denied experiencing any auditory or visual hallucinations at the time of the assessment. He reported feeling down and rated his depression as 7 out of 10. In contrast, he denied experiencing feelings of anxiety. The patient disclosed a history of substance use, including cocaine and marijuana. He stated that his last use of cocaine was two days ago and also reported using suboxone.   On evaluation, the patient's appearance was well-groomed and  appropriate for the environment. His behavior was cooperative. He communicated clearly, with speech at a normal rate and volume. His mood was characterized by depression and his affect was congruent with his mood. His thought process was coherent and linear, with logical thought content. There were no indications of delusions or reacting to internal stimuli. The patient denied auditory or visual hallucinations at this time and confirmed the absence of current suicidal or homicidal ideations.   Past Psychiatric History:  past psychiatric history of conduct disorder adolescent onset type, dysthymic disorder, major depressive disorder recurrent episode severe, polysubstance abuse, severe major depression with psychotic features, mood congruent, ADHD  [place] Scale:  Flowsheet Row [facility] Most recent reading at 04/02/2022  1:46 PM ED from 04/02/2022 [facility] Most recent reading at 04/02/2022 12:54 PM Admission (Discharged) [facility] Most recent reading at 04/24/2018  1:00 PM  C-SSRS RISK CATEGORY High Risk High Risk Low Risk       Past Medical History:  Past Medical History:  Diagnosis Date   ADHD (attention deficit hyperactivity disorder)    Depression    No pertinent past medical history    History reviewed. No pertinent surgical history. Family History:  Family History  Problem Relation Age of Onset   Depression Mother    Drug abuse Mother    Depression Maternal Grandmother    Drug abuse Father    Diabetes Other    Social History:  Social History   Substance and Sexual Activity  Alcohol Use No   Comment: occ     Social History   Substance and  Sexual Activity  Drug Use Yes   Frequency: 7.0 times per week   Types: Benzodiazepines, Marijuana, Hydrocodone, Other-see comments    Social History   Socioeconomic History    Marital status: Single    Spouse name: Not on file   Number of children: Not on file   Years of education: Not on file   Highest education level: Not on file  Occupational History   Occupation: Consulting civil engineer    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 [employer]  Tobacco Use   Smoking status: Former    Packs/day: 1.50    Years: 1.00    Total pack years: 1.50    Types: Cigarettes   Smokeless tobacco: Never  Substance and Sexual Activity   Alcohol use: No    Comment: occ   Drug use: Yes    Frequency: 7.0 times per week    Types: Benzodiazepines, Marijuana, Hydrocodone, Other-see comments   Sexual activity: Yes    Partners: Female    Birth control/protection: Condom  Other Topics Concern   Not on file  Social History Narrative   Not on file   Social Determinants of Health   Financial Resource Strain: Not on file  Food Insecurity: Not on file  Transportation Needs: Not on file  Physical Activity: Not on file  Stress: Not on file  Social Connections: Not on file    Sleep: Fair  Appetite:  Good  Current Medications: Current Facility-Administered Medications  Medication Dose Route Frequency Provider Last Rate Last Admin   nicotine (NICODERM CQ - dosed in mg/24 hours) patch 21 mg  21 mg Transdermal Daily Rancour, Stephen, MD   21 mg at 04/02/22 1931   Current Outpatient Medications  Medication Sig Dispense Refill   citalopram (CELEXA) 20 MG tablet Take 1 tablet (20 mg total) by mouth daily. For depression (Patient not taking: Reported on 04/03/2022) 30 tablet 0   naproxen (NAPROSYN) 500 MG tablet Take 1 tablet (500 mg total) by mouth 2 (two) times daily. (May buy from over the counter): For pain (Patient not taking: Reported on 04/03/2022) 30 tablet 0   nicotine (NICODERM CQ - DOSED IN MG/24 HOURS) 21 mg/24hr patch Place 1 patch (21 mg total) onto the skin daily. (May buy from over the counter): For smoking cessation (Patient not taking: Reported on 04/03/2022) 28 patch 0   QUEtiapine  (SEROQUEL) 25 MG tablet Take 1 tablet (25 mg total) by mouth at bedtime. For mood control/agitation (Patient not taking: Reported on 04/03/2022) 30 tablet 0    Lab Results:  Results for orders placed or performed during the hospital encounter of 04/02/22 (from the past 48 hour(s))  Comprehensive metabolic panel     Status: Abnormal   Collection Time: 04/02/22 12:55 PM  Result Value Ref Range   Sodium 142 135 - 145 mmol/L   Potassium 4.2 3.5 - 5.1 mmol/L   Chloride 108 98 - 111 mmol/L   CO2 29 22 - 32 mmol/L   Glucose, Bld 81 70 - 99 mg/dL    Comment: Glucose reference range applies only to samples taken after fasting for at least 8 hours.   BUN 17 6 - 20 mg/dL   Creatinine, Ser 6.01 0.61 - 1.24 mg/dL   Calcium 8.7 (L) 8.9 - 10.3 mg/dL   Total Protein 6.1 (L) 6.5 - 8.1 g/dL   Albumin 3.4 (L) 3.5 - 5.0 g/dL   AST 18 15 - 41 U/L   ALT 15 0 - 44 U/L   Alkaline  Phosphatase 47 38 - 126 U/L   Total Bilirubin 0.8 0.3 - 1.2 mg/dL   GFR, Estimated >38 >93 mL/min    Comment: (NOTE) Calculated using the CKD-EPI Creatinine Equation (2021)    Anion gap 5 5 - 15    Comment: Performed at [facility], [address] [place], [place] [address]  cbc     Status: None   Collection Time: 04/02/22 12:55 PM  Result Value Ref Range   WBC 6.4 4.0 - 10.5 K/uL   RBC 4.28 4.22 - 5.81 MIL/uL   Hemoglobin 13.8 13.0 - 17.0 g/dL   HCT 76.8 11.5 - 72.6 %   MCV 96.7 80.0 - 100.0 fL   MCH 32.2 26.0 - 34.0 pg   MCHC 33.3 30.0 - 36.0 g/dL   RDW 20.3 55.9 - 74.1 %   Platelets 176 150 - 400 K/uL   nRBC 0.0 0.0 - 0.2 %    Comment: Performed at [facility], [address] [place], [place] [address]  Rapid urine drug screen (hospital performed)     Status: Abnormal   Collection Time: 04/02/22 12:55 PM  Result Value Ref Range   Opiates NONE DETECTED NONE DETECTED   Cocaine POSITIVE (A) NONE DETECTED   Benzodiazepines NONE DETECTED NONE DETECTED   Amphetamines NONE  DETECTED NONE DETECTED   Tetrahydrocannabinol POSITIVE (A) NONE DETECTED   Barbiturates NONE DETECTED NONE DETECTED    Comment: (NOTE) DRUG SCREEN FOR MEDICAL PURPOSES ONLY.  IF CONFIRMATION IS NEEDED FOR ANY PURPOSE, NOTIFY LAB WITHIN 5 DAYS.  LOWEST DETECTABLE LIMITS FOR URINE DRUG SCREEN Drug Class                     Cutoff (ng/mL) Amphetamine and metabolites    1000 Barbiturate and metabolites    200 Benzodiazepine                 200 Opiates and metabolites        300 Cocaine and metabolites        300 THC                            50 Performed at [facility], [address] [place], [place] [address]   Ethanol     Status: None   Collection Time: 04/02/22  1:30 PM  Result Value Ref Range   Alcohol, Ethyl (B) <10 <10 mg/dL    Comment: (NOTE) Lowest detectable limit for serum alcohol is 10 mg/dL.  For medical purposes only. Performed at [facility] [place], [address] [place], [place] [address]   Resp Panel by RT-PCR (Flu A&B, Covid) Anterior Nasal Swab     Status: None   Collection Time: 04/02/22  3:26 PM   Specimen: Anterior Nasal Swab  Result Value Ref Range   SARS Coronavirus 2 by RT PCR NEGATIVE NEGATIVE    Comment: (NOTE) SARS-CoV-2 target nucleic acids are NOT DETECTED.  The SARS-CoV-2 RNA is generally detectable in upper respiratory specimens during the acute phase of infection. The lowest concentration of SARS-CoV-2 viral copies this assay can detect is 138 copies/mL. A negative result does not preclude SARS-Cov-2 infection and should not be used as the sole basis for treatment or other patient management decisions. A negative result may occur with  improper specimen collection/handling, submission of specimen other than nasopharyngeal swab, presence of viral mutation(s) within the areas targeted by this  assay, and inadequate number of viral copies(<138 copies/mL). A negative result must be combined with clinical  observations, patient history, and epidemiological information. The expected result is Negative.  Fact Sheet for Patients:  BloggerCourse.com  Fact Sheet for Healthcare Providers:  SeriousBroker.it  This test is no t yet approved or cleared by the [place] FDA and  has been authorized for detection and/or diagnosis of SARS-CoV-2 by FDA under an Emergency Use Authorization (EUA). This EUA will remain  in effect (meaning this test can be used) for the duration of the COVID-19 declaration under Section 564(b)(1) of the Act, 21 U.S.C.section 360bbb-3(b)(1), unless the authorization is terminated  or revoked sooner.       Influenza A by PCR NEGATIVE NEGATIVE   Influenza B by PCR NEGATIVE NEGATIVE    Comment: (NOTE) The Xpert Xpress SARS-CoV-2/FLU/RSV plus assay is intended as an aid in the diagnosis of influenza from Nasopharyngeal swab specimens and should not be used as a sole basis for treatment. Nasal washings and aspirates are unacceptable for Xpert Xpress SARS-CoV-2/FLU/RSV testing.  Fact Sheet for Patients: BloggerCourse.com  Fact Sheet for Healthcare Providers: SeriousBroker.it  This test is not yet approved or cleared by the [place] FDA and has been authorized for detection and/or diagnosis of SARS-CoV-2 by FDA under an Emergency Use Authorization (EUA). This EUA will remain in effect (meaning this test can be used) for the duration of the COVID-19 declaration under Section 564(b)(1) of the Act, 21 U.S.C. section 360bbb-3(b)(1), unless the authorization is terminated or revoked.  Performed at [facility], [address] [place], [place] [address]     Blood Alcohol level:  Lab Results  Component Value Date   ETH <10 04/02/2022   ETH <10 04/23/2018    Physical Findings:  CIWA:    COWS:     Musculoskeletal: Strength & Muscle  Tone: within normal limits Gait & Station: normal Patient leans: N/A  Psychiatric Specialty Exam:  Presentation  General Appearance:  Appropriate for Environment; Casual  Eye Contact: Good  Speech: Clear and Coherent; Normal Rate  Speech Volume: Normal  Handedness: Right   Mood and Affect  Mood: Depressed  Affect: Congruent   Thought Process  Thought Processes: Coherent; Linear  Descriptions of Associations:Intact  Orientation:Full (Time, Place and Person)  Thought Content:Logical  History of Schizophrenia/Schizoaffective disorder:No data recorded Duration of Psychotic Symptoms:No data recorded Hallucinations:Hallucinations: None  Ideas of Reference:None  Suicidal Thoughts:Suicidal Thoughts: No SI Passive Intent and/or Plan: Without Plan; Without Intent  Homicidal Thoughts:Homicidal Thoughts: No HI Active Intent and/or Plan: With Intent; With Plan; With Means to Carry Out (Homicidal thoughts towards the father.)   Sensorium  Memory: Immediate Good; Recent Good; Remote Good  Judgment: Impaired  Insight: Present   Executive Functions  Concentration: Fair  Attention Span: Fair  Recall: Good  Fund of Knowledge: Good  Language: Good   Psychomotor Activity  Psychomotor Activity: Psychomotor Activity: Normal   Assets  Assets: Communication Skills; Desire for Improvement; Physical Health   Sleep  Sleep: Sleep: Fair Number of Hours of Sleep: 2    Physical Exam: Physical Exam Constitutional:      General: He is not in acute distress.    Appearance: He is not ill-appearing, toxic-appearing or diaphoretic.  HENT:     Right Ear: External ear normal.     Left Ear: External ear normal.  Eyes:     General:        Right eye: No discharge.  Left eye: No discharge.  Cardiovascular:     Rate and Rhythm: Normal rate.  Pulmonary:     Effort: Pulmonary effort is normal. No respiratory distress.  Musculoskeletal:         General: Normal range of motion.  Neurological:     Mental Status: He is alert and oriented to person, place, and time.  Psychiatric:        Thought Content: Thought content is not paranoid or delusional. Thought content does not include homicidal or suicidal ideation.    Review of Systems  Constitutional:  Negative for chills, diaphoresis, fever, malaise/fatigue and weight loss.  Respiratory:  Negative for cough and shortness of breath.   Cardiovascular:  Negative for chest pain.  Gastrointestinal:  Negative for diarrhea, nausea and vomiting.  Neurological:  Negative for dizziness and seizures.  Psychiatric/Behavioral:  Positive for depression, substance abuse and suicidal ideas. Negative for hallucinations and memory loss. The patient is nervous/anxious and has insomnia.     Blood pressure (!) 108/54, pulse 69, temperature 98.1 F (36.7 C), temperature source Oral, resp. rate 18, height 6\' 5"  (1.956 m), weight 86.2 kg, SpO2 98 %. Body mass index is 22.53 kg/m.   Medical Decision Making: hristopher Kamer is a 26 y.o. Caucasian male who presented voluntarily accompanied by his grandmother as a walk-in to [facility] for worsening suicidal ideation, and depression.  Patient has past psychiatric history of conduct disorder adolescent onset type, dysthymic disorder, major depressive disorder recurrent episode severe, polysubstance abuse, severe major depression with psychotic features, mood congruent, ADHD.  As per chart review patient has multiple ED visits and admissions to [facility] health system from 2013-2019.  Patient was incarcerated from Oct 22, 2021 to October 2023 for breaking and entering, larceny of gun, and obtaining property by false pretense.  Patient to reappear in court in [place] on May 05, 2022 for grand larceny.   Disposition: Recommend psychiatric Inpatient admission when medically cleared.     Rozetta Nunnery, NP 04/03/2022, 1:10 PM

## 2022-04-04 NOTE — Discharge Instructions (Signed)
Transfer to [facility]. ?

## 2022-04-04 NOTE — ED Notes (Signed)
Remains asleep no distressed or disturbed sleep noted. 

## 2022-04-04 NOTE — ED Provider Notes (Signed)
Emergency Medicine Observation Re-evaluation Note  Cory Duncan is a 26 y.o. male, seen on rounds today.  Pt initially presented to the ED for complaints of feelings of depressing and SI.  No new c/o this AM. Pt has been accepted to [facility] for further psychiatric care.   Physical Exam  BP (!) 106/58 (BP Location: Right Arm)   Pulse 63   Temp 98.4 F (36.9 C) (Oral)   Resp 16   Ht 1.956 m (6\' 5" )   Wt 86.2 kg   SpO2 96%   BMI 22.53 kg/m  Physical Exam General: resting, calm. Cardiac: regular rate.  Lungs: breathing comfortably. Psych: calm, resting.   ED Course / MDM    I have reviewed the labs performed to date as well as medications administered while in observation.  Recent changes in the last 24 hours include ED obs, reassessment.  Plan  Pt accepted to [facility] for further psych care, Dr Franchot Mimes.   Pt currently appears stable for transfer/transport.     Lajean Saver, MD 04/04/22 [phone]

## 2022-04-04 NOTE — ED Notes (Signed)
Patient  discharged off unit to facility per provider. Patient voluntary to [facility]. Patient alert, calm, cooperative, no s/s of distress at this time. Discharge information and belongings given to [facility] for facility . Patient ambulatory off unit, escorted by NT. Patient transported by [facility]

## 2022-06-20 ENCOUNTER — Telehealth: Payer: Self-pay
